# Patient Record
Sex: Female | Born: 1940 | Race: White | Hispanic: No | Marital: Married | Smoking: Never smoker
Health system: Southern US, Community
[De-identification: ages and names within clinical notes are randomized; demographics above are authoritative.]

## PROBLEM LIST (undated history)

## (undated) DIAGNOSIS — I10 Essential (primary) hypertension: Secondary | ICD-10-CM

## (undated) HISTORY — PX: APPENDECTOMY: SHX54

## (undated) HISTORY — PX: PARTIAL HYSTERECTOMY: SHX80

## (undated) HISTORY — PX: REPLACEMENT TOTAL KNEE: SUR1224

## (undated) HISTORY — PX: HERNIA REPAIR: SHX51

## (undated) HISTORY — PX: CARDIAC VALVE REPLACEMENT: SHX585

---

## 2017-09-03 MED ORDER — ONDANSETRON HCL 4 MG/2ML IJ SOLN
INTRAMUSCULAR | Status: AC
  Filled 2017-09-03: qty 2

## 2017-09-04 ENCOUNTER — Inpatient Hospital Stay (HOSPITAL_COMMUNITY): Payer: Medicare Other

## 2017-09-04 ENCOUNTER — Inpatient Hospital Stay (HOSPITAL_COMMUNITY)
Admission: EM | Admit: 2017-09-04 | Discharge: 2017-09-13 | DRG: 963 | Disposition: A | Discharge status: Home / Self Care | Payer: Medicare Other | Attending: Physician Assistant | Admitting: Physician Assistant

## 2017-09-04 ENCOUNTER — Emergency Department (HOSPITAL_COMMUNITY): Payer: Medicare Other

## 2017-09-04 ENCOUNTER — Encounter (HOSPITAL_COMMUNITY): Payer: Self-pay | Admitting: Emergency Medicine

## 2017-09-04 DIAGNOSIS — M542 Cervicalgia: Secondary | ICD-10-CM | POA: Diagnosis not present

## 2017-09-04 DIAGNOSIS — S42002A Fracture of unspecified part of left clavicle, initial encounter for closed fracture: Secondary | ICD-10-CM | POA: Diagnosis present

## 2017-09-04 DIAGNOSIS — J942 Hemothorax: Secondary | ICD-10-CM

## 2017-09-04 DIAGNOSIS — D62 Acute posthemorrhagic anemia: Secondary | ICD-10-CM | POA: Diagnosis present

## 2017-09-04 DIAGNOSIS — S52592A Other fractures of lower end of left radius, initial encounter for closed fracture: Secondary | ICD-10-CM | POA: Diagnosis not present

## 2017-09-04 DIAGNOSIS — S41032A Puncture wound without foreign body of left shoulder, initial encounter: Secondary | ICD-10-CM | POA: Diagnosis not present

## 2017-09-04 DIAGNOSIS — S270XXA Traumatic pneumothorax, initial encounter: Secondary | ICD-10-CM | POA: Diagnosis present

## 2017-09-04 DIAGNOSIS — K661 Hemoperitoneum: Secondary | ICD-10-CM | POA: Diagnosis present

## 2017-09-04 DIAGNOSIS — S27321A Contusion of lung, unilateral, initial encounter: Secondary | ICD-10-CM

## 2017-09-04 DIAGNOSIS — S066X0A Traumatic subarachnoid hemorrhage without loss of consciousness, initial encounter: Secondary | ICD-10-CM | POA: Diagnosis present

## 2017-09-04 DIAGNOSIS — J9601 Acute respiratory failure with hypoxia: Secondary | ICD-10-CM | POA: Diagnosis not present

## 2017-09-04 DIAGNOSIS — S27329A Contusion of lung, unspecified, initial encounter: Secondary | ICD-10-CM | POA: Diagnosis present

## 2017-09-04 DIAGNOSIS — J189 Pneumonia, unspecified organism: Secondary | ICD-10-CM

## 2017-09-04 DIAGNOSIS — Z952 Presence of prosthetic heart valve: Secondary | ICD-10-CM

## 2017-09-04 DIAGNOSIS — R40241 Glasgow coma scale score 13-15, unspecified time: Secondary | ICD-10-CM | POA: Diagnosis present

## 2017-09-04 DIAGNOSIS — S065X0A Traumatic subdural hemorrhage without loss of consciousness, initial encounter: Secondary | ICD-10-CM | POA: Diagnosis present

## 2017-09-04 DIAGNOSIS — S41012A Laceration without foreign body of left shoulder, initial encounter: Secondary | ICD-10-CM | POA: Diagnosis present

## 2017-09-04 DIAGNOSIS — S42032A Displaced fracture of lateral end of left clavicle, initial encounter for closed fracture: Secondary | ICD-10-CM

## 2017-09-04 DIAGNOSIS — S2232XA Fracture of one rib, left side, initial encounter for closed fracture: Secondary | ICD-10-CM | POA: Diagnosis not present

## 2017-09-04 DIAGNOSIS — S2243XA Multiple fractures of ribs, bilateral, initial encounter for closed fracture: Principal | ICD-10-CM | POA: Diagnosis present

## 2017-09-04 DIAGNOSIS — S2239XA Fracture of one rib, unspecified side, initial encounter for closed fracture: Secondary | ICD-10-CM

## 2017-09-04 DIAGNOSIS — Y92410 Unspecified street and highway as the place of occurrence of the external cause: Secondary | ICD-10-CM | POA: Diagnosis not present

## 2017-09-04 DIAGNOSIS — I10 Essential (primary) hypertension: Secondary | ICD-10-CM | POA: Diagnosis present

## 2017-09-04 DIAGNOSIS — T1490XA Injury, unspecified, initial encounter: Secondary | ICD-10-CM

## 2017-09-04 DIAGNOSIS — S37032A Laceration of left kidney, unspecified degree, initial encounter: Secondary | ICD-10-CM | POA: Diagnosis present

## 2017-09-04 DIAGNOSIS — Z96651 Presence of right artificial knee joint: Secondary | ICD-10-CM | POA: Diagnosis present

## 2017-09-04 DIAGNOSIS — S52502A Unspecified fracture of the lower end of left radius, initial encounter for closed fracture: Secondary | ICD-10-CM | POA: Diagnosis present

## 2017-09-04 DIAGNOSIS — J90 Pleural effusion, not elsewhere classified: Secondary | ICD-10-CM | POA: Diagnosis present

## 2017-09-04 DIAGNOSIS — I959 Hypotension, unspecified: Secondary | ICD-10-CM | POA: Diagnosis not present

## 2017-09-04 DIAGNOSIS — S2249XA Multiple fractures of ribs, unspecified side, initial encounter for closed fracture: Secondary | ICD-10-CM

## 2017-09-04 HISTORY — DX: Essential (primary) hypertension: I10

## 2017-09-04 LAB — BPAM FFP
Blood Product Expiration Date: 201811222359
Blood Product Expiration Date: 201811222359
ISSUE DATE / TIME: 201811172341
ISSUE DATE / TIME: 201811172341
UNIT TYPE AND RH: 6200
Unit Type and Rh: 6200

## 2017-09-04 LAB — I-STAT CHEM 8, ED
BUN: 13 mg/dL (ref 6–20)
Calcium, Ion: 1.11 mmol/L — ABNORMAL LOW (ref 1.15–1.40)
Chloride: 105 mmol/L (ref 101–111)
Creatinine, Ser: 0.9 mg/dL (ref 0.44–1.00)
Glucose, Bld: 120 mg/dL — ABNORMAL HIGH (ref 65–99)
HEMATOCRIT: 37 % (ref 36.0–46.0)
HEMOGLOBIN: 12.6 g/dL (ref 12.0–15.0)
POTASSIUM: 4 mmol/L (ref 3.5–5.1)
SODIUM: 139 mmol/L (ref 135–145)
TCO2: 25 mmol/L (ref 22–32)

## 2017-09-04 LAB — CBC
HCT: 34.3 % — ABNORMAL LOW (ref 36.0–46.0)
HEMATOCRIT: 32.1 % — AB (ref 36.0–46.0)
HEMOGLOBIN: 11.1 g/dL — AB (ref 12.0–15.0)
HEMOGLOBIN: 10.4 g/dL — AB (ref 12.0–15.0)
MCH: 29.1 pg (ref 26.0–34.0)
MCH: 29.1 pg (ref 26.0–34.0)
MCHC: 32.4 g/dL (ref 30.0–36.0)
MCHC: 32.4 g/dL (ref 30.0–36.0)
MCV: 90 fL (ref 78.0–100.0)
MCV: 89.7 fL (ref 78.0–100.0)
PLATELETS: 143 10*3/uL — AB (ref 150–400)
Platelets: 138 10*3/uL — ABNORMAL LOW (ref 150–400)
RBC: 3.81 MIL/uL — ABNORMAL LOW (ref 3.87–5.11)
RBC: 3.58 MIL/uL — AB (ref 3.87–5.11)
RDW: 15.6 % — ABNORMAL HIGH (ref 11.5–15.5)
RDW: 15.4 % (ref 11.5–15.5)
WBC: 11.5 10*3/uL — ABNORMAL HIGH (ref 4.0–10.5)
WBC: 11.6 10*3/uL — AB (ref 4.0–10.5)

## 2017-09-04 LAB — PREPARE FRESH FROZEN PLASMA
UNIT DIVISION: 0
Unit division: 0

## 2017-09-04 LAB — URINALYSIS, ROUTINE W REFLEX MICROSCOPIC

## 2017-09-04 LAB — COMPREHENSIVE METABOLIC PANEL
ALBUMIN: 2.8 g/dL — AB (ref 3.5–5.0)
ALT: 53 U/L (ref 14–54)
AST: 99 U/L — AB (ref 15–41)
Alkaline Phosphatase: 45 U/L (ref 38–126)
Anion gap: 7 (ref 5–15)
BUN: 12 mg/dL (ref 6–20)
CHLORIDE: 104 mmol/L (ref 101–111)
CO2: 24 mmol/L (ref 22–32)
CREATININE: 0.87 mg/dL (ref 0.44–1.00)
Calcium: 7.8 mg/dL — ABNORMAL LOW (ref 8.9–10.3)
GFR calc non Af Amer: >60 mL/min (ref >60)
GLUCOSE: 155 mg/dL — AB (ref 65–99)
Potassium: 3.3 mmol/L — ABNORMAL LOW (ref 3.5–5.1)
SODIUM: 135 mmol/L (ref 135–145)
Total Bilirubin: 0.6 mg/dL (ref 0.3–1.2)
Total Protein: 4.7 g/dL — ABNORMAL LOW (ref 6.5–8.1)

## 2017-09-04 LAB — BASIC METABOLIC PANEL
ANION GAP: 6 (ref 5–15)
BUN: 12 mg/dL (ref 6–20)
CO2: 24 mmol/L (ref 22–32)
Calcium: 7.9 mg/dL — ABNORMAL LOW (ref 8.9–10.3)
Chloride: 109 mmol/L (ref 101–111)
Creatinine, Ser: 0.74 mg/dL (ref 0.44–1.00)
GFR calc Af Amer: >60 mL/min (ref >60)
GLUCOSE: 174 mg/dL — AB (ref 65–99)
POTASSIUM: 4 mmol/L (ref 3.5–5.1)
Sodium: 139 mmol/L (ref 135–145)

## 2017-09-04 LAB — PROTIME-INR
INR: 1.39
Prothrombin Time: 17 seconds — ABNORMAL HIGH (ref 11.4–15.2)

## 2017-09-04 LAB — ABO/RH: ABO/RH(D): O POS

## 2017-09-04 LAB — URINALYSIS, MICROSCOPIC (REFLEX): Squamous Epithelial / LPF: NONE SEEN

## 2017-09-04 LAB — CDS SEROLOGY

## 2017-09-04 LAB — PREPARE RBC (CROSSMATCH)

## 2017-09-04 LAB — ETHANOL: Alcohol, Ethyl (B): <10 mg/dL (ref <10)

## 2017-09-04 LAB — I-STAT CG4 LACTIC ACID, ED: Lactic Acid, Venous: 2.71 mmol/L (ref 0.5–1.9)

## 2017-09-04 LAB — MRSA PCR SCREENING: MRSA by PCR: NEGATIVE

## 2017-09-04 MED ORDER — ONDANSETRON HCL 4 MG/2ML IJ SOLN
4.0000 mg | Freq: Once | INTRAMUSCULAR | Status: AC
  Administered 2017-09-04: 4 mg via INTRAVENOUS

## 2017-09-04 MED ORDER — MORPHINE SULFATE (PF) 4 MG/ML IV SOLN
4.0000 mg | INTRAVENOUS | Status: DC | PRN
  Administered 2017-09-06: 4 mg via INTRAVENOUS
  Filled 2017-09-04: qty 1

## 2017-09-04 MED ORDER — SODIUM CHLORIDE 0.9 % IV SOLN: Freq: Once | INTRAVENOUS | Status: DC

## 2017-09-04 MED ORDER — ONDANSETRON HCL 4 MG/2ML IJ SOLN: 4.0000 mg | Freq: Four times a day (QID) | INTRAMUSCULAR | Status: DC | PRN

## 2017-09-04 MED ORDER — MORPHINE SULFATE (PF) 4 MG/ML IV SOLN
2.0000 mg | INTRAVENOUS | Status: DC | PRN
  Administered 2017-09-04 – 2017-09-06 (×12): 2 mg via INTRAVENOUS
  Filled 2017-09-04 (×12): qty 1

## 2017-09-04 MED ORDER — HYDROMORPHONE HCL 1 MG/ML IJ SOLN
0.5000 mg | INTRAMUSCULAR | Status: DC | PRN
Start: 2017-09-04 — End: 2017-09-06
  Administered 2017-09-04: 0.5 mg via INTRAVENOUS
  Filled 2017-09-04: qty 0.5

## 2017-09-04 MED ORDER — CEFAZOLIN SODIUM-DEXTROSE 2-4 GM/100ML-% IV SOLN
INTRAVENOUS | Status: AC
  Filled 2017-09-04: qty 100

## 2017-09-04 MED ORDER — IOPAMIDOL (ISOVUE-300) INJECTION 61%
100.0000 mL | Freq: Once | INTRAVENOUS | Status: AC | PRN
  Administered 2017-09-04: 100 mL via INTRAVENOUS

## 2017-09-04 MED ORDER — CEFAZOLIN SODIUM-DEXTROSE 1-4 GM/50ML-% IV SOLN
1.0000 g | Freq: Once | INTRAVENOUS | Status: AC
  Administered 2017-09-04: 1 g via INTRAVENOUS

## 2017-09-04 MED ORDER — POTASSIUM CHLORIDE IN NACL 20-0.9 MEQ/L-% IV SOLN
INTRAVENOUS | Status: DC
  Administered 2017-09-04 – 2017-09-07 (×7): via INTRAVENOUS
  Filled 2017-09-04 (×9): qty 1000

## 2017-09-04 MED ORDER — SODIUM CHLORIDE 0.9 % IV BOLUS (SEPSIS)
1000.0000 mL | Freq: Once | INTRAVENOUS | Status: AC
  Administered 2017-09-04: 1000 mL via INTRAVENOUS

## 2017-09-04 MED ORDER — ONDANSETRON 4 MG PO TBDP: 4.0000 mg | ORAL_TABLET | Freq: Four times a day (QID) | ORAL | Status: DC | PRN

## 2017-09-04 MED ORDER — MORPHINE SULFATE (PF) 4 MG/ML IV SOLN
1.0000 mg | INTRAVENOUS | Status: DC | PRN
  Administered 2017-09-04: 1 mg via INTRAVENOUS
  Filled 2017-09-04: qty 1

## 2017-09-04 MED ORDER — DEXTROSE 5 % IV SOLN
0.0000 ug/min | INTRAVENOUS | Status: DC
  Filled 2017-09-04 (×2): qty 4

## 2017-09-04 NOTE — Progress Notes (Signed)
Orthopedic Tech Progress Note Patient Details:  Marcelo Baldygatha Marucci June 10, 1941 [id number]  Ortho Devices Type of Ortho Device: Volar splint Ortho Device/Splint Location: lue plaster volar Ortho Device/Splint Interventions: Ordered, Application   Trinna PostMartinez, [place] Woodrow J 09/04/2017, 5:08 AM

## 2017-09-04 NOTE — ED Notes (Signed)
Jewelry (watch, rings) given to pt's daughter

## 2017-09-04 NOTE — Consult Note (Signed)
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 also some swelling around metacarpal heads #23 and 4.  EPL FPL interosseous intact on that left hand.  Left elbow range of motion supple without crepitus or swelling.  Left shoulder also has good range of motion.  There is a small punctate laceration in the mid clavicle area.  This may or may not represent an open fracture.  Assessment/Plan: Impression is nondisplaced left distal radius fracture and left clavicle fracture which may or may not be open.  Plan at this time is after obtaining consent and anesthetizing and prepping and draping the area that small punctate laceration is open to centimeter on either side.  Time out was called prior to doing that.  The area was explored and I could not connect that laceration to any underlying bone.  Nonetheless the area is irrigated out with a liter of saline with a bulb.  A and Iodosorb packing is placed.  That can be changed tomorrow.  Clavicle fracture nonoperative at this time.  Continue IV antibiotics for 48 hours  G Scott Marlou Sa 09/04/2017, 2:23 AM

## 2017-09-04 NOTE — ED Notes (Signed)
QNS x2 will contact 2nd phleb for blood draw.

## 2017-09-04 NOTE — ED Notes (Signed)
Dr. August Saucerean (ortho) continues to be at bedside completing debridement of left chest puncture mark, looking for signs of open fracture

## 2017-09-04 NOTE — ED Notes (Addendum)
IV team at bedside 
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
## 2017-09-04 NOTE — Progress Notes (Signed)
Subjective: Patient stable.  Complains primarily of wrist pain.  The wrist splint is adjusted.  We will also redress that open area around the left clavicle which is packed.   Objective: Vital signs in last 24 hours: Temp:  [94.9 F (34.9 C)-98.3 F (36.8 C)] 97.4 F (36.3 C) (11/18 0740) Pulse Rate:  [55-78] 78 (11/18 1050) Resp:  [6-30] 15 (11/18 1050) BP: (51-132)/(31-98) 122/55 (11/18 1000) SpO2:  [94 %-100 %] 96 % (11/18 1050) Weight:  [130 lb (59 kg)] 130 lb (59 kg) (11/18 0010)  Intake/Output from previous day: 11/17 0701 - 11/18 0700 In: 4177.7 [I.V.:1600; Blood:527.7; IV Piggyback:2050] Out: 410 [Urine:410] Intake/Output this shift: Total I/O In: 875 [I.V.:875] Out: -   Exam:  Dorsiflexion/Plantar flexion intact bilateral lower extremities Left wrist volar splint wrapping loosened. Dressing changed on left shoulder region around the clavicle She's having fairly minimal pain moving the left arm around   Labs: Recent Labs    09/04/17 0024 09/04/17 0106 09/04/17 0651  HGB 12.6 11.1* 10.4*   Recent Labs    09/04/17 0106 09/04/17 0651  WBC 11.5* 11.6*  RBC 3.81* 3.58*  HCT 34.3* 32.1*  PLT 143* 138*   Recent Labs    09/04/17 0106 09/04/17 0651  NA 135 139  K 3.3* 4.0  CL 104 109  CO2 24 24  BUN 12 12  CREATININE 0.87 0.74  GLUCOSE 155* 174*  CALCIUM 7.8* 7.9*   Recent Labs    09/04/17 0106  INR 1.39    Assessment/Plan: Plan at this time is for removal of the packing tomorrow.  Continue IV antibiotics for 48 hours.  We will decide for or against any type of formal washout on Tuesday depending on how it looks tomorrow regarding the clavicle  The left wrist is nonoperative.  We will keep that in a splint and recheck radiographs in a week to make sure there is been no displacement.  She'll need to be nonweightbearing through that left upper extremity for 6 weeks   G Dorene GrebeScott Fransheska Willingham 09/04/2017, 10:55 AM
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
## 2017-09-04 NOTE — ED Notes (Signed)
Ortho at bedside.
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
## 2017-09-04 NOTE — ED Notes (Signed)
Pts family at bedside

## 2017-09-04 NOTE — H&P (Addendum)
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
## 2017-09-04 NOTE — ED Notes (Signed)
FAST exam done by Dr. Adela LankFloyd, negative
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
## 2017-09-05 ENCOUNTER — Encounter (HOSPITAL_COMMUNITY): Payer: Self-pay | Admitting: Radiology

## 2017-09-05 ENCOUNTER — Inpatient Hospital Stay (HOSPITAL_COMMUNITY): Payer: Medicare Other

## 2017-09-05 LAB — CBC WITH DIFFERENTIAL/PLATELET
BASOS PCT: 0 %
Basophils Absolute: 0 10*3/uL (ref 0.0–0.1)
Eosinophils Absolute: 0.1 10*3/uL (ref 0.0–0.7)
Eosinophils Relative: 1 %
HEMATOCRIT: 28.2 % — AB (ref 36.0–46.0)
HEMOGLOBIN: 9.2 g/dL — AB (ref 12.0–15.0)
LYMPHS PCT: 10 %
Lymphs Abs: 0.9 10*3/uL (ref 0.7–4.0)
MCH: 30.1 pg (ref 26.0–34.0)
MCHC: 32.6 g/dL (ref 30.0–36.0)
MCV: 92.2 fL (ref 78.0–100.0)
MONOS PCT: 10 %
Monocytes Absolute: 0.9 10*3/uL (ref 0.1–1.0)
NEUTROS ABS: 7.2 10*3/uL (ref 1.7–7.7)
NEUTROS PCT: 79 %
Platelets: 111 10*3/uL — ABNORMAL LOW (ref 150–400)
RBC: 3.06 MIL/uL — ABNORMAL LOW (ref 3.87–5.11)
RDW: 16.4 % — ABNORMAL HIGH (ref 11.5–15.5)
WBC: 9.2 10*3/uL (ref 4.0–10.5)

## 2017-09-05 LAB — TYPE AND SCREEN
ABO/RH(D): O POS
Antibody Screen: NEGATIVE
UNIT DIVISION: 0
UNIT DIVISION: 0
Unit division: 0

## 2017-09-05 LAB — BPAM RBC
BLOOD PRODUCT EXPIRATION DATE: 201811252359
BLOOD PRODUCT EXPIRATION DATE: 201812072359
Blood Product Expiration Date: 201811232359
ISSUE DATE / TIME: 201811180326
ISSUE DATE / TIME: 201811182137
ISSUE DATE / TIME: 201811182137
UNIT TYPE AND RH: 9500
Unit Type and Rh: 5100
Unit Type and Rh: 9500

## 2017-09-05 LAB — BASIC METABOLIC PANEL
ANION GAP: 4 — AB (ref 5–15)
BUN: 7 mg/dL (ref 6–20)
CHLORIDE: 112 mmol/L — AB (ref 101–111)
CO2: 24 mmol/L (ref 22–32)
CREATININE: 0.51 mg/dL (ref 0.44–1.00)
Calcium: 8 mg/dL — ABNORMAL LOW (ref 8.9–10.3)
GFR calc non Af Amer: >60 mL/min (ref >60)
Glucose, Bld: 110 mg/dL — ABNORMAL HIGH (ref 65–99)
Potassium: 4 mmol/L (ref 3.5–5.1)
Sodium: 140 mmol/L (ref 135–145)

## 2017-09-05 LAB — PREPARE FRESH FROZEN PLASMA: UNIT DIVISION: 0

## 2017-09-05 LAB — BPAM FFP
BLOOD PRODUCT EXPIRATION DATE: 201811192359
ISSUE DATE / TIME: 201811180512
Unit Type and Rh: 6200

## 2017-09-05 LAB — BLOOD PRODUCT ORDER (VERBAL) VERIFICATION

## 2017-09-05 MED ORDER — IOPAMIDOL (ISOVUE-370) INJECTION 76%
INTRAVENOUS | Status: AC
  Administered 2017-09-05: 50 mL
  Filled 2017-09-05: qty 50

## 2017-09-05 MED ORDER — ORAL CARE MOUTH RINSE
15.0000 mL | Freq: Two times a day (BID) | OROMUCOSAL | Status: DC
  Administered 2017-09-05 – 2017-09-13 (×17): 15 mL via OROMUCOSAL

## 2017-09-05 MED ORDER — MIDAZOLAM HCL 2 MG/2ML IJ SOLN: 4.0000 mg | Freq: Once | INTRAMUSCULAR | Status: DC

## 2017-09-05 MED ORDER — METOPROLOL TARTRATE 12.5 MG HALF TABLET
12.5000 mg | ORAL_TABLET | Freq: Every day | ORAL | Status: DC
  Administered 2017-09-05 – 2017-09-12 (×8): 12.5 mg via ORAL
  Filled 2017-09-05 (×8): qty 1

## 2017-09-05 MED ORDER — ESCITALOPRAM OXALATE 10 MG PO TABS
10.0000 mg | ORAL_TABLET | Freq: Every day | ORAL | Status: DC
  Administered 2017-09-05 – 2017-09-13 (×9): 10 mg via ORAL
  Filled 2017-09-05 (×9): qty 1
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
## 2017-09-05 NOTE — Progress Notes (Signed)
Met with pt's daughter, Leda Quail, at bedside to discuss discharge planning.  PT/OT recommending CIR.  Daughter states family prefers to get pt back to [place], [place] ASAP to do rehab there.  She prefers CIR as opposed to SNF.  Case manager will investigate inpatient rehab facilities available in [place] area, and make referrals appropriately, with daughter's permission.  Daughter open to exploring transport via medical flight, if needed.    Offered emotional support to daughter, as she is quite tearful over the loss of her father, who she states was a huge support to her.  She states that pt has not asked about her husband once, as to his whereabouts or condition.  She states pt thinks she is in [place] at times.  Daughter and brother plan to tell pt about her husband's death once brother arrives back in [place].  Will continue to provide support and assist with discharge planning.  Will follow with updates.    Reinaldo Raddle, RN, BSN  Trauma/Neuro ICU Case Manager [phone]

## 2017-09-05 NOTE — Progress Notes (Signed)
Rehab Admissions Coordinator Note:  Patient was screened by Trish MageLogue, Aliana Kreischer M for appropriateness for an Inpatient Acute Rehab Consult.  At this time, we are recommending Inpatient Rehab consult.  Trish MageLogue, Mercie Balsley M 09/05/2017, 12:26 PM  I can be reached at [phone].
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
## 2017-09-06 ENCOUNTER — Inpatient Hospital Stay (HOSPITAL_COMMUNITY): Payer: Medicare Other

## 2017-09-06 ENCOUNTER — Other Ambulatory Visit: Payer: Self-pay

## 2017-09-06 DIAGNOSIS — T1490XA Injury, unspecified, initial encounter: Secondary | ICD-10-CM

## 2017-09-06 DIAGNOSIS — S2232XA Fracture of one rib, left side, initial encounter for closed fracture: Secondary | ICD-10-CM

## 2017-09-06 DIAGNOSIS — M542 Cervicalgia: Secondary | ICD-10-CM

## 2017-09-06 DIAGNOSIS — S27321A Contusion of lung, unilateral, initial encounter: Secondary | ICD-10-CM

## 2017-09-06 LAB — CBC WITH DIFFERENTIAL/PLATELET
Basophils Absolute: 0 10*3/uL (ref 0.0–0.1)
Basophils Relative: 0 %
Eosinophils Absolute: 0.1 10*3/uL (ref 0.0–0.7)
Eosinophils Relative: 1 %
HCT: 27.7 % — ABNORMAL LOW (ref 36.0–46.0)
Hemoglobin: 8.7 g/dL — ABNORMAL LOW (ref 12.0–15.0)
Lymphocytes Relative: 9 %
Lymphs Abs: 0.8 10*3/uL (ref 0.7–4.0)
MCH: 28.9 pg (ref 26.0–34.0)
MCHC: 31.4 g/dL (ref 30.0–36.0)
MCV: 92 fL (ref 78.0–100.0)
Monocytes Absolute: 0.9 10*3/uL (ref 0.1–1.0)
Monocytes Relative: 10 %
Neutro Abs: 7.4 10*3/uL (ref 1.7–7.7)
Neutrophils Relative %: 80 %
Platelets: 114 10*3/uL — ABNORMAL LOW (ref 150–400)
RBC: 3.01 MIL/uL — ABNORMAL LOW (ref 3.87–5.11)
RDW: 15.8 % — ABNORMAL HIGH (ref 11.5–15.5)
WBC: 9.2 10*3/uL (ref 4.0–10.5)

## 2017-09-06 LAB — BLOOD GAS, ARTERIAL
Acid-Base Excess: 3 mmol/L — ABNORMAL HIGH (ref 0.0–2.0)
BICARBONATE: 27.1 mmol/L (ref 20.0–28.0)
Drawn by: 252031
O2 Content: 3 L/min
O2 Saturation: 96 %
PH ART: 7.426 (ref 7.350–7.450)
PO2 ART: 76.1 mmHg — AB (ref 83.0–108.0)
Patient temperature: 98.6
pCO2 arterial: 41.9 mmHg (ref 32.0–48.0)

## 2017-09-06 LAB — BASIC METABOLIC PANEL
Anion gap: 5 (ref 5–15)
BUN: <5 mg/dL — ABNORMAL LOW (ref 6–20)
CALCIUM: 8.1 mg/dL — AB (ref 8.9–10.3)
CHLORIDE: 108 mmol/L (ref 101–111)
CO2: 26 mmol/L (ref 22–32)
Creatinine, Ser: 0.48 mg/dL (ref 0.44–1.00)
GFR calc non Af Amer: >60 mL/min (ref >60)
Glucose, Bld: 117 mg/dL — ABNORMAL HIGH (ref 65–99)
Potassium: 3.5 mmol/L (ref 3.5–5.1)
Sodium: 139 mmol/L (ref 135–145)

## 2017-09-06 MED ORDER — OXYCODONE HCL 5 MG PO TABS
5.0000 mg | ORAL_TABLET | ORAL | Status: DC | PRN
  Administered 2017-09-06 (×2): 5 mg via ORAL
  Filled 2017-09-06 (×2): qty 1

## 2017-09-06 MED ORDER — ALBUTEROL SULFATE (2.5 MG/3ML) 0.083% IN NEBU
2.5000 mg | INHALATION_SOLUTION | Freq: Four times a day (QID) | RESPIRATORY_TRACT | Status: DC | PRN
  Administered 2017-09-09 – 2017-09-10 (×4): 2.5 mg via RESPIRATORY_TRACT
  Filled 2017-09-06 (×6): qty 3

## 2017-09-06 MED ORDER — IPRATROPIUM-ALBUTEROL 0.5-2.5 (3) MG/3ML IN SOLN
3.0000 mL | Freq: Four times a day (QID) | RESPIRATORY_TRACT | Status: DC
  Administered 2017-09-06: 3 mL via RESPIRATORY_TRACT
  Filled 2017-09-06: qty 3

## 2017-09-06 MED ORDER — ACETAMINOPHEN 500 MG PO TABS
1000.0000 mg | ORAL_TABLET | Freq: Three times a day (TID) | ORAL | Status: DC
  Administered 2017-09-06 – 2017-09-09 (×8): 1000 mg via ORAL
  Filled 2017-09-06 (×8): qty 2

## 2017-09-06 MED ORDER — ACETAMINOPHEN 325 MG PO TABS
650.0000 mg | ORAL_TABLET | Freq: Four times a day (QID) | ORAL | Status: DC
  Administered 2017-09-06: 650 mg via ORAL
  Filled 2017-09-06: qty 2

## 2017-09-06 MED ORDER — OXYCODONE HCL 5 MG PO TABS
5.0000 mg | ORAL_TABLET | ORAL | Status: DC
  Administered 2017-09-06 – 2017-09-07 (×4): 5 mg via ORAL
  Filled 2017-09-06 (×4): qty 1

## 2017-09-06 MED ORDER — IPRATROPIUM-ALBUTEROL 0.5-2.5 (3) MG/3ML IN SOLN
3.0000 mL | Freq: Three times a day (TID) | RESPIRATORY_TRACT | Status: DC
  Administered 2017-09-06 – 2017-09-08 (×5): 3 mL via RESPIRATORY_TRACT
  Filled 2017-09-06 (×5): qty 3

## 2017-09-06 MED ORDER — MORPHINE SULFATE (PF) 4 MG/ML IV SOLN
1.0000 mg | INTRAVENOUS | Status: DC | PRN
  Administered 2017-09-07 – 2017-09-08 (×4): 2 mg via INTRAVENOUS
  Filled 2017-09-06 (×4): qty 1
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
## 2017-09-06 NOTE — Progress Notes (Signed)
Patient unable to follow commands to perform flutter at this time. Will not stay awake. RN notified. Left at bedside. Will attempt again at next scheduled treatment time.

## 2017-09-06 NOTE — Progress Notes (Signed)
Left clavicle site ok - no evidence of infection Left wrist splinted - swelling present No indications for intervention at this time May need clavicle fixation in [place] if fx consolidation does not occur
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
## 2017-09-07 DIAGNOSIS — S2232XA Fracture of one rib, left side, initial encounter for closed fracture: Secondary | ICD-10-CM

## 2017-09-07 DIAGNOSIS — S27321A Contusion of lung, unilateral, initial encounter: Secondary | ICD-10-CM

## 2017-09-07 DIAGNOSIS — M542 Cervicalgia: Secondary | ICD-10-CM

## 2017-09-07 DIAGNOSIS — T1490XA Injury, unspecified, initial encounter: Secondary | ICD-10-CM

## 2017-09-07 LAB — BASIC METABOLIC PANEL
Anion gap: 7 (ref 5–15)
BUN: 6 mg/dL (ref 6–20)
CHLORIDE: 107 mmol/L (ref 101–111)
CO2: 25 mmol/L (ref 22–32)
CREATININE: 0.45 mg/dL (ref 0.44–1.00)
Calcium: 8 mg/dL — ABNORMAL LOW (ref 8.9–10.3)
GFR calc non Af Amer: >60 mL/min (ref >60)
Glucose, Bld: 99 mg/dL (ref 65–99)
POTASSIUM: 3.5 mmol/L (ref 3.5–5.1)
Sodium: 139 mmol/L (ref 135–145)

## 2017-09-07 LAB — CBC
HEMATOCRIT: 28.4 % — AB (ref 36.0–46.0)
Hemoglobin: 9.1 g/dL — ABNORMAL LOW (ref 12.0–15.0)
MCH: 29.7 pg (ref 26.0–34.0)
MCHC: 32 g/dL (ref 30.0–36.0)
MCV: 92.8 fL (ref 78.0–100.0)
PLATELETS: 148 10*3/uL — AB (ref 150–400)
RBC: 3.06 MIL/uL — AB (ref 3.87–5.11)
RDW: 15.7 % — ABNORMAL HIGH (ref 11.5–15.5)
WBC: 8.3 10*3/uL (ref 4.0–10.5)

## 2017-09-07 MED ORDER — OXYCODONE HCL 5 MG PO TABS
5.0000 mg | ORAL_TABLET | ORAL | Status: DC | PRN
  Administered 2017-09-07 – 2017-09-08 (×2): 5 mg via ORAL
  Filled 2017-09-07 (×2): qty 1

## 2017-09-07 MED ORDER — FERROUS GLUCONATE 324 (38 FE) MG PO TABS
324.0000 mg | ORAL_TABLET | Freq: Two times a day (BID) | ORAL | Status: DC
  Administered 2017-09-07 – 2017-09-12 (×9): 324 mg via ORAL
  Filled 2017-09-07 (×13): qty 1

## 2017-09-07 NOTE — Progress Notes (Signed)
Met with pt and family; they prefer that pt be discharged to inpatient rehab at [facility] in [place].  Son and daughter insistent to know when pt will be able to discharge back to [place].  I informed them that this would be up to the attending physician, and that it would likely not be this week, as pt would have to be medically stable AND insurance authorization would have to be received for rehab.  I made them aware that we would not take any chances on moving her too soon, as this would put her at risk for decline.  They do not seem to understand this.  I will defer to attending MD to speak with family regarding pt's medical stability for travel to [place].    Will notify [facility] of family's choice, and to initiate insurance authorization.  Family made aware that this may also be delayed due to Thanksgiving holiday, and insurance company will be closed one, possibly two days.    Reinaldo Raddle, RN, BSN  Trauma/Neuro ICU Case Manager [phone]
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
## 2017-09-07 NOTE — Progress Notes (Signed)
Chaplain responded to request from [employer], who called to inquire about getting the [facility].  Even though referrals typically only happen during normal business hours, Spiritual Care Director Theda BelfastBob Hamilton has said he will investigate the possibility of getting this family in tomorrow, despite the holiday, given the unusual situation. The need will be for four adults: the son and daughter who have been here, as well as the son's son (patient grandson) and his very recent bride, who [place] serve in the [employer]. It was their wedding on Saturday to which the extended family had come from [place], and it was on the drive [facility] from their [facility] that [facility].   Chaplain visited at length with son, Marquita PalmsMario, and daughter, Cleotis NipperLucia, both separately and together. There is significant disagreement/conflict between the two about how to inform patient of her husband's death, and a general tension between the two stemming from long-standing family dynamics. Son tends to resist open expressions of grief, while patient and daughter, are - according to both children - "very emotional".   Chaplain also briefly visited with patient, who appeared tired and slightly disoriented. She frequently asks her son where her husband is and expresses a repeated desire to go home to [place]. Patient indicated that she is an active Catholic in her home in [place], and welcomed the possibility for prayer and Holy Communion from a priest.  Will recommend to incoming chaplain to facilitate that. Chaplain also arranged for a priest to speak with patient's son on the telephone late this evening as the family prepares to tell the patient the news of her husband's death, hopefully tomorrow.    Both adult children expressed some frustration with doctors and case management. Chaplain suspects they possibly feel a lack of sympathy about their situation. Son  and daughter specifically want a deeper understanding of what the medical concerns are that are hampering a transfer. Chaplain contacted [facility] about this. She indicated she will ask incoming [facility] to speak to the family tomorrow.  Based on the multiple layers of emotional trauma, complicated grief and geographical distance, chaplain recommends continual efforts to faciliate the patient's move back to [place] as soon as is medically feasible.    Please call as requested or needed.   Belia HemanKristina N Lonney Revak, [place]plain [phone]      09/07/17 2300  Clinical Encounter Type  Visited With Family;Patient  Visit Type Initial;Spiritual support;Trauma;Death  Referral From Nurse  Consult/Referral To Chaplain  Recommendations [facility], chaplain to follow up  Spiritual Encounters  Spiritual Needs Emotional;Grief support  Stress Factors  Patient Stress Factors Lack of knowledge  Family Stress Factors Family relationships;Loss of control;Major life changes

## 2017-09-07 NOTE — Progress Notes (Signed)
Patient ID: Jacqueline Decker, female   DOB: 11-20-1940, 76 y.o.   MRN: [id number]    Assessment: Grade 4 left renal laceration: She has been managed conservatively and remained stable.  Hemoglobin remained stable with no tachycardia or hypotension.  She maintains good urine output with normal creatinine.  Has begun PT.  At this point there does not appear to be any indication for surgical or IR intervention.  Will need follow-up imaging as an outpatient.  Plan: 1.  Agree that patient is stable to continue with PT. 2.  She will follow-up with me as an outpatient for repeat imaging. 3.  Urology will remain available for any further urologic issues that should arise at [phone].    Subjective: Patient seems sleepy but arousable this morning.  She does have some continued back pain but does not indicate that it has become any worse.  Possibly somewhat improved.  Objective: Vital signs in last 24 hours: Temp:  [97.8 F (36.6 C)-99.3 F (37.4 C)] 98.5 F (36.9 C) (11/21 0400) Pulse Rate:  [66-93] 78 (11/21 0400) Resp:  [16-34] 17 (11/21 0400) BP: (139-160)/(57-73) 159/66 (11/21 0400) SpO2:  [90 %-97 %] 95 % (11/21 0400)A  Intake/Output from previous day: 11/20 0701 - 11/21 0700 In: 1878.8 [P.O.:440; I.V.:1438.8] Out: 2000 [Urine:2000] Intake/Output this shift: No intake/output data recorded.  Past Medical History:  Diagnosis Date  . Hypertension     Physical Exam:  Lungs - Normal respiratory effort, chest expands symmetrically.  Abdomen - Soft, non-tender & non-distended.  Lab Results: Recent Labs    09/05/17 0912 09/06/17 0414  WBC 9.2 9.2  HGB 9.2* 8.7*  HCT 28.2* 27.7*   BMET Recent Labs    09/05/17 0912 09/06/17 0414  NA 140 139  K 4.0 3.5  CL 112* 108  CO2 24 26  GLUCOSE 110* 117*  BUN 7 <5*  CREATININE 0.51 0.48  CALCIUM 8.0* 8.1*   No results for input(s): LABURIN in the last 72 hours. Results for orders placed or performed during the hospital  encounter of 09/04/17  MRSA PCR Screening     Status: None   Collection Time: 09/04/17  2:52 AM  Result Value Ref Range Status   MRSA by PCR NEGATIVE NEGATIVE Final    Comment:        The GeneXpert MRSA Assay (FDA approved for NASAL specimens only), is one component of a comprehensive MRSA colonization surveillance program. It is not intended to diagnose MRSA infection nor to guide or monitor treatment for MRSA infections.     Studies/Results: Dg Chest Port 1 View  Result Date: 09/06/2017 CLINICAL DATA:  Chest pain EXAM: PORTABLE CHEST 1 VIEW COMPARISON:  September 04, 2017 FINDINGS: There is persistent left lower lobe consolidation with left pleural effusion. Right lung is clear except for slight right base atelectasis. Heart is mildly enlarged with pulmonary vascularity within normal limits. No adenopathy. There is a fracture of the distal left clavicle as well as multiple displaced rib fractures on the left. There is subcutaneous air on the right with fracture of the right lateral fourth rib, better seen on prior study. Patient is status post aortic valve replacement. IMPRESSION: Persistent left lower lobe airspace consolidation with left pleural effusion. No new opacity. Stable cardiac prominence. Rib fractures bilaterally as well as fracture of the lateral left clavicle. Subcutaneous air remains in the right. No pneumothorax demonstrable. Electronically Signed   By: Bretta BangWilliam  Woodruff III M.D.   On: 09/06/2017 10:05  Efrem Pitstick C 09/07/2017, 7:01 AM
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
## 2017-09-08 ENCOUNTER — Inpatient Hospital Stay (HOSPITAL_COMMUNITY): Payer: Medicare Other

## 2017-09-08 DIAGNOSIS — J9601 Acute respiratory failure with hypoxia: Secondary | ICD-10-CM

## 2017-09-08 LAB — ECHOCARDIOGRAM COMPLETE
Height: 63 in
Weight: 2080 oz

## 2017-09-08 MED ORDER — OXYCODONE HCL 5 MG PO TABS
5.0000 mg | ORAL_TABLET | ORAL | Status: DC | PRN
  Administered 2017-09-08 (×2): 5 mg via ORAL
  Administered 2017-09-09 – 2017-09-10 (×6): 10 mg via ORAL
  Administered 2017-09-11: 5 mg via ORAL
  Administered 2017-09-11 – 2017-09-13 (×7): 10 mg via ORAL
  Filled 2017-09-08 (×6): qty 2
  Filled 2017-09-08 (×3): qty 1
  Filled 2017-09-08 (×3): qty 2
  Filled 2017-09-08: qty 1
  Filled 2017-09-08 (×4): qty 2

## 2017-09-08 MED ORDER — POLYETHYLENE GLYCOL 3350 17 G PO PACK
17.0000 g | PACK | Freq: Every day | ORAL | Status: DC
  Administered 2017-09-08 – 2017-09-12 (×5): 17 g via ORAL
  Filled 2017-09-08 (×4): qty 1

## 2017-09-08 MED ORDER — DOCUSATE SODIUM 100 MG PO CAPS
100.0000 mg | ORAL_CAPSULE | Freq: Two times a day (BID) | ORAL | Status: DC
  Administered 2017-09-08 – 2017-09-12 (×9): 100 mg via ORAL
  Filled 2017-09-08 (×9): qty 1

## 2017-09-08 MED ORDER — BISACODYL 10 MG RE SUPP
10.0000 mg | Freq: Every day | RECTAL | Status: DC | PRN
  Administered 2017-09-11: 10 mg via RECTAL
  Filled 2017-09-08 (×2): qty 1
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
## 2017-09-08 NOTE — Progress Notes (Signed)
  Echocardiogram 2D Echocardiogram has been performed.  Jacqueline Decker, Jacqueline Decker F 09/08/2017, 3:11 PM
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
## 2017-09-09 ENCOUNTER — Inpatient Hospital Stay (HOSPITAL_COMMUNITY): Payer: Medicare Other

## 2017-09-09 MED ORDER — ACETAMINOPHEN 500 MG PO TABS
1000.0000 mg | ORAL_TABLET | Freq: Four times a day (QID) | ORAL | Status: DC | PRN
  Administered 2017-09-09 – 2017-09-12 (×6): 1000 mg via ORAL
  Filled 2017-09-09 (×6): qty 2

## 2017-09-09 MED ORDER — ACETYLCYSTEINE 20 % IN SOLN
4.0000 mL | Freq: Two times a day (BID) | RESPIRATORY_TRACT | Status: AC
  Administered 2017-09-09 – 2017-09-10 (×4): 4 mL via RESPIRATORY_TRACT
  Filled 2017-09-09 (×5): qty 4
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
## 2017-09-10 NOTE — Plan of Care (Signed)
  Progressing Education: Knowledge of General Education information will improve 09/10/2017 0646 - Progressing by Neldon NewportFenske, Kaisley Stiverson P, RN Health Behavior/Discharge Planning: Ability to manage health-related needs will improve 09/10/2017 0646 - Progressing by Neldon NewportFenske, Quintavis Brands P, RN Clinical Measurements: Ability to maintain clinical measurements within normal limits will improve 09/10/2017 0646 - Progressing by Neldon NewportFenske, Jaysie Benthall P, RN Will remain free from infection 09/10/2017 0646 - Progressing by Neldon NewportFenske, Romanita Fager P, RN Diagnostic test results will improve 09/10/2017 0646 - Progressing by Neldon NewportFenske, Inas Avena P, RN Respiratory complications will improve 09/10/2017 0646 - Progressing by Neldon NewportFenske, Daven Pinckney P, RN Cardiovascular complication will be avoided 09/10/2017 0646 - Progressing by Neldon NewportFenske, Breyona Swander P, RN Activity: Risk for activity intolerance will decrease 09/10/2017 0646 - Progressing by Neldon NewportFenske, Maille Halliwell P, RN Coping: Level of anxiety will decrease 09/10/2017 0646 - Progressing by Neldon NewportFenske, Elianis Fischbach P, RN Elimination: Will not experience complications related to bowel motility 09/10/2017 0646 - Progressing by Neldon NewportFenske, Elsie Sakuma P, RN Will not experience complications related to urinary retention 09/10/2017 0646 - Progressing by Neldon NewportFenske, Carsyn Taubman P, RN Pain Managment: General experience of comfort will improve 09/10/2017 0646 - Progressing by Neldon NewportFenske, Erricka Falkner P, RN Safety: Ability to remain free from injury will improve 09/10/2017 0646 - Progressing by Neldon NewportFenske, Shereena Berquist P, RN Skin Integrity: Risk for impaired skin integrity will decrease 09/10/2017 0646 - Progressing by Neldon NewportFenske, Chantae Soo P, RN Education: Knowledge of General Education information will improve 09/10/2017 [phone] - Progressing by Neldon NewportFenske, Laqueisha Catalina P, RN Health Behavior/Discharge Planning: Ability to manage health-related needs will improve 09/10/2017 0646 - Progressing by Neldon NewportFenske, Shoua Ressler P, RN Clinical Measurements: Ability to maintain clinical  measurements within normal limits will improve 09/10/2017 0646 - Progressing by Neldon NewportFenske, Rosalena Mccorry P, RN Will remain free from infection 09/10/2017 0646 - Progressing by Neldon NewportFenske, Sueko Dimichele P, RN Diagnostic test results will improve 09/10/2017 0646 - Progressing by Neldon NewportFenske, Shyniece Scripter P, RN Respiratory complications will improve 09/10/2017 0646 - Progressing by Neldon NewportFenske, Franco Duley P, RN Cardiovascular complication will be avoided 09/10/2017 0646 - Progressing by Neldon NewportFenske, Koby Hartfield P, RN Activity: Risk for activity intolerance will decrease 09/10/2017 0646 - Progressing by Neldon NewportFenske, Jailee Jaquez P, RN Nutrition: Adequate nutrition will be maintained 09/10/2017 0646 - Progressing by Neldon NewportFenske, Catlyn Shipton P, RN Coping: Level of anxiety will decrease 09/10/2017 0646 - Progressing by Neldon NewportFenske, Charle Clear P, RN Elimination: Will not experience complications related to bowel motility 09/10/2017 0646 - Progressing by Neldon NewportFenske, Faten Frieson P, RN Will not experience complications related to urinary retention 09/10/2017 0646 - Progressing by Neldon NewportFenske, Mckenzee Beem P, RN Safety: Ability to remain free from injury will improve 09/10/2017 0646 - Progressing by Neldon NewportFenske, Casson Catena P, RN Skin Integrity: Risk for impaired skin integrity will decrease 09/10/2017 0646 - Progressing by Neldon NewportFenske, Palyn Scrima P, RN
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
## 2017-09-11 MED ORDER — METOPROLOL TARTRATE 5 MG/5ML IV SOLN
5.0000 mg | Freq: Four times a day (QID) | INTRAVENOUS | Status: DC | PRN
  Administered 2017-09-11: 5 mg via INTRAVENOUS
  Filled 2017-09-11: qty 5

## 2017-09-11 NOTE — Plan of Care (Signed)
  Education: Knowledge of General Education information will improve 09/11/2017 1240 - Progressing by Faylene MillionWhiteside, Anaisha Mago A, RN 09/11/2017 1238 - Progressing by Faylene MillionWhiteside, Ellon Marasco A, RN 09/11/2017 [phone] - Progressing by Faylene MillionWhiteside, Demyah Smyre A, RN

## 2017-09-11 NOTE — Plan of Care (Signed)
Patient expressed fear of "not getting better". I spoke with patient about the normal process of healing and how improvement takes time. Patient states she understands.

## 2017-09-11 NOTE — Plan of Care (Signed)
  Progressing Education: Knowledge of General Education information will improve 09/11/2017 0419 - Progressing by Neldon NewportFenske, Forest Redwine P, RN Health Behavior/Discharge Planning: Ability to manage health-related needs will improve 09/11/2017 0419 - Progressing by Neldon NewportFenske, Stephani Janak P, RN Clinical Measurements: Ability to maintain clinical measurements within normal limits will improve 09/11/2017 0419 - Progressing by Neldon NewportFenske, Gaylon Melchor P, RN Will remain free from infection 09/11/2017 0419 - Progressing by Neldon NewportFenske, Kameko Hukill P, RN Diagnostic test results will improve 09/11/2017 0419 - Progressing by Neldon NewportFenske, Dawanna Grauberger P, RN Respiratory complications will improve 09/11/2017 0419 - Progressing by Neldon NewportFenske, Hassaan Crite P, RN Cardiovascular complication will be avoided 09/11/2017 0419 - Progressing by Neldon NewportFenske, Morse Brueggemann P, RN Activity: Risk for activity intolerance will decrease 09/11/2017 0419 - Progressing by Neldon NewportFenske, Gustavia Carie P, RN Coping: Level of anxiety will decrease 09/11/2017 0419 - Progressing by Neldon NewportFenske, Monee Dembeck P, RN Elimination: Will not experience complications related to bowel motility 09/11/2017 0419 - Progressing by Neldon NewportFenske, Bryley Chrisman P, RN Will not experience complications related to urinary retention 09/11/2017 0419 - Progressing by Neldon NewportFenske, Josefa Syracuse P, RN Pain Managment: General experience of comfort will improve 09/11/2017 0419 - Progressing by Neldon NewportFenske, Laiba Fuerte P, RN Safety: Ability to remain free from injury will improve 09/11/2017 0419 - Progressing by Neldon NewportFenske, Javeah Loeza P, RN Skin Integrity: Risk for impaired skin integrity will decrease 09/11/2017 0419 - Progressing by Neldon NewportFenske, Pharell Rolfson P, RN Education: Knowledge of General Education information will improve 09/11/2017 0419 - Progressing by Neldon NewportFenske, Deakin Lacek P, RN Health Behavior/Discharge Planning: Ability to manage health-related needs will improve 09/11/2017 0419 - Progressing by Neldon NewportFenske, Joycelyn Liska P, RN Clinical Measurements: Ability to maintain clinical  measurements within normal limits will improve 09/11/2017 0419 - Progressing by Neldon NewportFenske, Satin Boal P, RN Will remain free from infection 09/11/2017 0419 - Progressing by Neldon NewportFenske, Lainee Lehrman P, RN Diagnostic test results will improve 09/11/2017 0419 - Progressing by Neldon NewportFenske, Claryssa Sandner P, RN Respiratory complications will improve 09/11/2017 0419 - Progressing by Neldon NewportFenske, Lander Eslick P, RN Cardiovascular complication will be avoided 09/11/2017 0419 - Progressing by Neldon NewportFenske, Heron Pitcock P, RN Activity: Risk for activity intolerance will decrease 09/11/2017 0419 - Progressing by Neldon NewportFenske, Lynford Espinoza P, RN Nutrition: Adequate nutrition will be maintained 09/11/2017 0419 - Progressing by Neldon NewportFenske, Davanta Meuser P, RN Coping: Level of anxiety will decrease 09/11/2017 0419 - Progressing by Neldon NewportFenske, Poppy Mcafee P, RN Elimination: Will not experience complications related to bowel motility 09/11/2017 0419 - Progressing by Neldon NewportFenske, Traniece Boffa P, RN Will not experience complications related to urinary retention 09/11/2017 0419 - Progressing by Neldon NewportFenske, Aqeel Norgaard P, RN Safety: Ability to remain free from injury will improve 09/11/2017 0419 - Progressing by Neldon NewportFenske, Bryen Hinderman P, RN Skin Integrity: Risk for impaired skin integrity will decrease 09/11/2017 0419 - Progressing by Neldon NewportFenske, Leotta Weingarten P, RN
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
## 2017-09-12 MED ORDER — ENOXAPARIN SODIUM 40 MG/0.4ML ~~LOC~~ SOLN
40.0000 mg | SUBCUTANEOUS | Status: DC
  Administered 2017-09-13: 40 mg via SUBCUTANEOUS
  Filled 2017-09-12 (×2): qty 0.4

## 2017-09-12 MED ORDER — LIDOCAINE 5 % EX PTCH
1.0000 | MEDICATED_PATCH | CUTANEOUS | Status: DC
  Administered 2017-09-12 – 2017-09-13 (×2): 1 via TRANSDERMAL
  Filled 2017-09-12 (×3): qty 1

## 2017-09-12 MED ORDER — METOPROLOL TARTRATE 12.5 MG HALF TABLET
12.5000 mg | ORAL_TABLET | Freq: Two times a day (BID) | ORAL | Status: DC
  Administered 2017-09-12 – 2017-09-13 (×2): 12.5 mg via ORAL
  Filled 2017-09-12 (×2): qty 1
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
## 2017-09-12 NOTE — Progress Notes (Signed)
2130- Patient's SBP was 182. Paged MD per standing orders. Received verbal order for Metoprolol 5mg  IV q6h PRN for SBP >180. @2143 - metoprolol 5mg  IV given, @2215  BP= 163/85.
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
## 2017-09-12 NOTE — Progress Notes (Signed)
[facility] with [employer] [facility]:  Currently no word on insurance authorization.  Faxed updated clinical information and PT/OT notes as requested.  Admissions liaison to notify case manager upon receiving insurance auth.  Pt medically stable for discharge to IP rehab once insurance auth received and travel arrangements can be arranged.    Quintella BatonJulie W. Senaida Chilcote, RN, BSN  Trauma/Neuro ICU Case Manager [phone]
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
## 2017-09-12 NOTE — Progress Notes (Signed)
Insurance authorization received from [employer] for rehab facility, per [facility].  Attempted to schedule flight with [employer] [facility] [employer] as previously discussed with pt and family.  Air ambulance company now requesting total cost of air transport up front, as pt has Medicare, and they will not receive any reimbursement.  Total cost of flight estimated at $14,920.  [employer]; they too, expect full cost of air transport up front, ranging from $9000 (no medical staff on board) to $15000 (with 2 medical personnel on board).   I notified pt and family of issue with flight scheduling, and they wonder if pt can go on commercial flight First Class with family member to assist.  Spoke with PA and Dr. Lindie SpruceWyatt:  MD prefers to order CXR first to check for stability and follow up tomorrow.  I have notified [facility] of these events.  Family aware, and are understanding.  They want pt to be as well as possible for trip home.  Will follow up in AM.   Mason with Rehab facility assures that bed will be available upon pt's dc.    Quintella BatonJulie W. Idamay Hosein, RN, BSN  Trauma/Neuro ICU Case Manager [phone]

## 2017-09-13 ENCOUNTER — Inpatient Hospital Stay (HOSPITAL_COMMUNITY): Payer: Medicare Other

## 2017-09-13 DIAGNOSIS — J9601 Acute respiratory failure with hypoxia: Secondary | ICD-10-CM

## 2017-09-13 LAB — CBC
HEMATOCRIT: 39.4 % (ref 36.0–46.0)
HEMOGLOBIN: 12.8 g/dL (ref 12.0–15.0)
MCH: 29.8 pg (ref 26.0–34.0)
MCHC: 32.5 g/dL (ref 30.0–36.0)
MCV: 91.6 fL (ref 78.0–100.0)
Platelets: 363 10*3/uL (ref 150–400)
RBC: 4.3 MIL/uL (ref 3.87–5.11)
RDW: 16.4 % — AB (ref 11.5–15.5)
WBC: 11.4 10*3/uL — ABNORMAL HIGH (ref 4.0–10.5)

## 2017-09-13 LAB — BASIC METABOLIC PANEL
Anion gap: 8 (ref 5–15)
BUN: 6 mg/dL (ref 6–20)
CALCIUM: 8.8 mg/dL — AB (ref 8.9–10.3)
CHLORIDE: 104 mmol/L (ref 101–111)
CO2: 27 mmol/L (ref 22–32)
CREATININE: 0.49 mg/dL (ref 0.44–1.00)
GFR calc non Af Amer: >60 mL/min (ref >60)
Glucose, Bld: 103 mg/dL — ABNORMAL HIGH (ref 65–99)
Potassium: 3.8 mmol/L (ref 3.5–5.1)
Sodium: 139 mmol/L (ref 135–145)

## 2017-09-13 MED ORDER — ALBUTEROL SULFATE (2.5 MG/3ML) 0.083% IN NEBU
2.5000 mg | INHALATION_SOLUTION | Freq: Four times a day (QID) | RESPIRATORY_TRACT | 12 refills | Status: AC | PRN
Start: 2017-09-13 — End: ?

## 2017-09-13 MED ORDER — OXYCODONE HCL 5 MG PO TABS: 5.0000 mg | ORAL_TABLET | ORAL | 0 refills | Status: AC | PRN

## 2017-09-13 MED ORDER — METHOCARBAMOL 500 MG PO TABS: 500.0000 mg | ORAL_TABLET | Freq: Three times a day (TID) | ORAL | Status: AC | PRN

## 2017-09-13 MED ORDER — ENOXAPARIN SODIUM 40 MG/0.4ML ~~LOC~~ SOLN: 40.0000 mg | SUBCUTANEOUS | Status: AC

## 2017-09-13 MED ORDER — METHOCARBAMOL 500 MG PO TABS
500.0000 mg | ORAL_TABLET | Freq: Three times a day (TID) | ORAL | Status: DC | PRN
Start: 2017-09-13 — End: 2017-09-13
  Administered 2017-09-13: 500 mg via ORAL
  Filled 2017-09-13: qty 1

## 2017-09-13 MED ORDER — FERROUS GLUCONATE 324 (38 FE) MG PO TABS: 324.0000 mg | ORAL_TABLET | Freq: Two times a day (BID) | ORAL | 3 refills | Status: AC

## 2017-09-13 MED ORDER — METHOCARBAMOL 500 MG PO TABS: 500.0000 mg | ORAL_TABLET | Freq: Three times a day (TID) | ORAL | 0 refills | Status: AC | PRN

## 2017-09-13 MED ORDER — METHOCARBAMOL 500 MG PO TABS: 500.0000 mg | ORAL_TABLET | Freq: Three times a day (TID) | ORAL | 0 refills | Status: DC | PRN

## 2017-09-13 MED ORDER — OXYCODONE HCL 5 MG PO TABS: 5.0000 mg | ORAL_TABLET | ORAL | 0 refills | Status: DC | PRN
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
## 2017-09-13 NOTE — Discharge Instructions (Signed)
During the drive back to [place], it is important to take frequent breaks and get out of the care to walk and stretch your legs.  Use your incentive spirometer frequently. Take your Discharge summary and information with you to the rehab center.   For the trip: I will send you with oxycodone IR, you make take 5-10 mg every 4 hours as needed for pain. I will also send you with Robaxin (muscle relaxer) and you may take 500 mg every 8 hours as needed for muscle spasm and tightness.  Remember to have the rehab center have you follow up with urology and orthopedic surgery.
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
## 2019-09-18 IMAGING — CR DG CHEST 2V
2 series · 2 of 2 positions shown · non-contrast
Comparison: 09/09/2017

CLINICAL DATA: Hemothorax on the left

EXAM:
CHEST  2 VIEW

[chest lat]
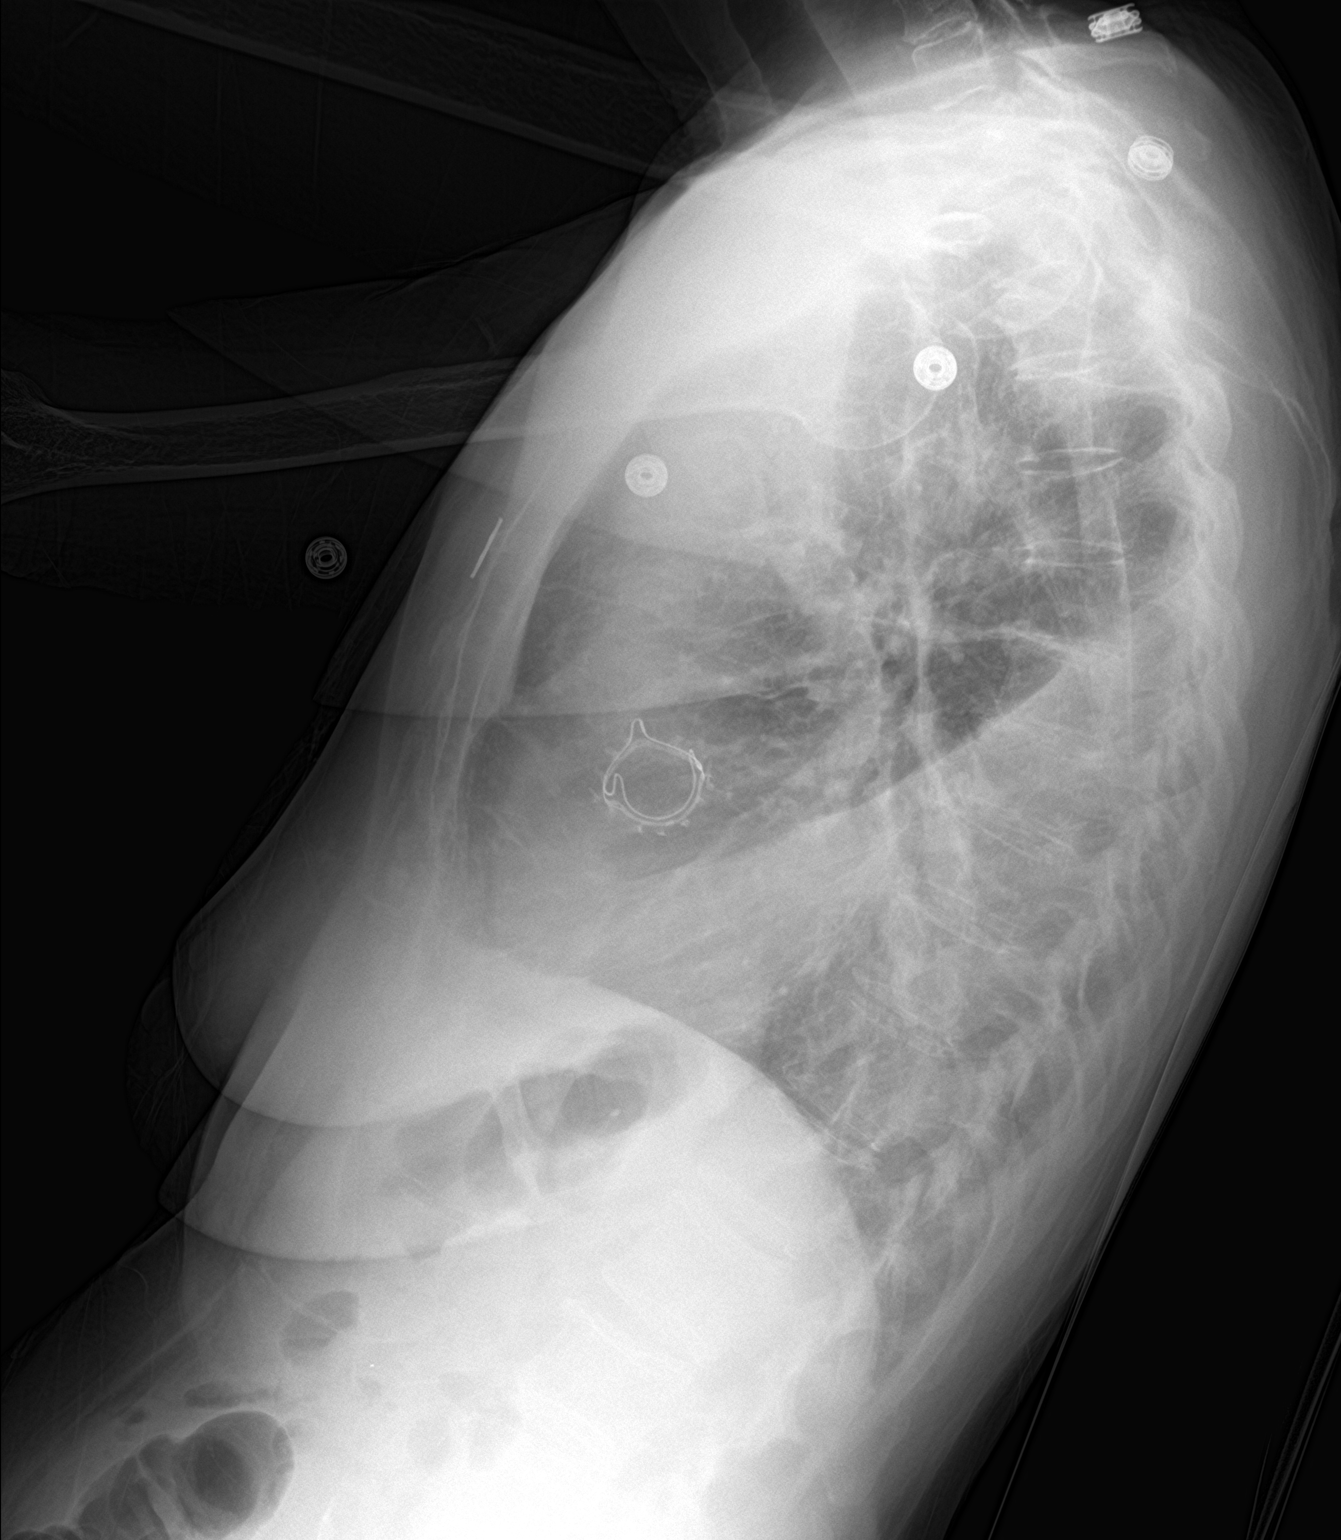

[chest ap]
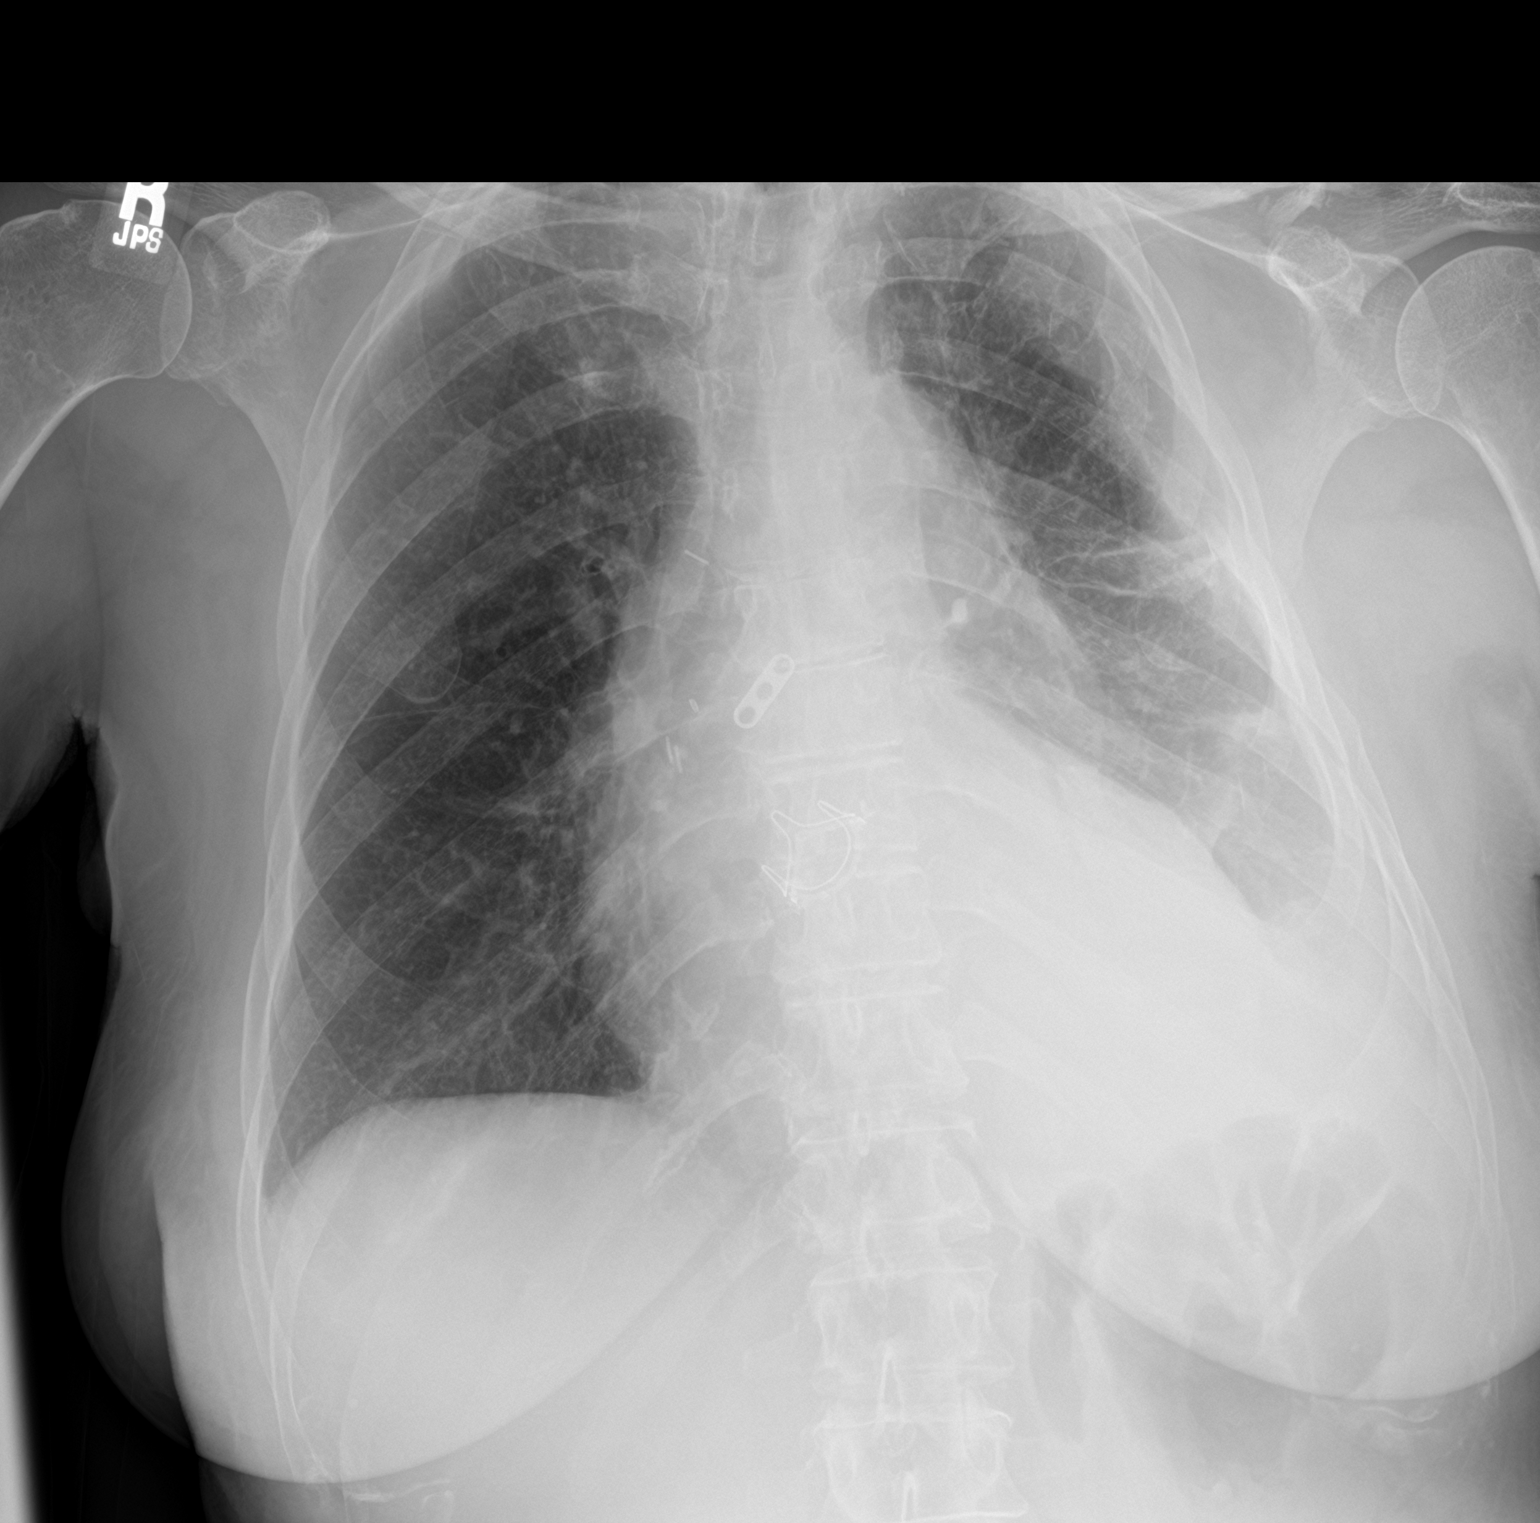

[2 of 2 positions shown; findings below may reference images not displayed]

FINDINGS: Left pleural effusion and left lower lung atelectasis or infiltrate
again noted. No change. No pneumothorax. No confluent opacity on the
right. Prior aortic valve replacement. Cardiomegaly, stable.
IMPRESSION: No significant change since prior study.
